# Patient Record
Sex: Female | Born: 1965 | Race: White | Hispanic: No | Marital: Married
Health system: Southern US, Community
[De-identification: ages and names within clinical notes are randomized; demographics above are authoritative.]

## PROBLEM LIST (undated history)

## (undated) DIAGNOSIS — I471 Supraventricular tachycardia: Secondary | ICD-10-CM

---

## 2020-05-21 ENCOUNTER — Encounter (HOSPITAL_COMMUNITY): Payer: Self-pay | Admitting: Emergency Medicine

## 2020-05-21 ENCOUNTER — Emergency Department (HOSPITAL_COMMUNITY)
Admission: EM | Admit: 2020-05-21 | Discharge: 2020-05-21 | Disposition: A | Discharge status: Home / Self Care | Payer: Managed Care, Other (non HMO) | Attending: Emergency Medicine | Admitting: Emergency Medicine

## 2020-05-21 ENCOUNTER — Emergency Department (HOSPITAL_COMMUNITY): Payer: Managed Care, Other (non HMO)

## 2020-05-21 ENCOUNTER — Other Ambulatory Visit: Payer: Self-pay

## 2020-05-21 DIAGNOSIS — R000 Tachycardia, unspecified: Secondary | ICD-10-CM | POA: Diagnosis present

## 2020-05-21 DIAGNOSIS — I471 Supraventricular tachycardia: Secondary | ICD-10-CM | POA: Diagnosis not present

## 2020-05-21 DIAGNOSIS — R110 Nausea: Secondary | ICD-10-CM | POA: Insufficient documentation

## 2020-05-21 HISTORY — DX: Supraventricular tachycardia: I47.1

## 2020-05-21 LAB — CBC WITH DIFFERENTIAL/PLATELET
Abs Immature Granulocytes: 0.04 10*3/uL (ref 0.00–0.07)
Basophils Absolute: 0.1 10*3/uL (ref 0.0–0.1)
Basophils Relative: 1 %
Eosinophils Absolute: 0.1 10*3/uL (ref 0.0–0.5)
Eosinophils Relative: 1 %
HCT: 40.7 % (ref 36.0–46.0)
Hemoglobin: 13.9 g/dL (ref 12.0–15.0)
Immature Granulocytes: 0 %
Lymphocytes Relative: 15 %
Lymphs Abs: 1.4 10*3/uL (ref 0.7–4.0)
MCH: 33.2 pg (ref 26.0–34.0)
MCHC: 34.2 g/dL (ref 30.0–36.0)
MCV: 97.1 fL (ref 80.0–100.0)
Monocytes Absolute: 0.7 10*3/uL (ref 0.1–1.0)
Monocytes Relative: 7 %
Neutro Abs: 6.8 10*3/uL (ref 1.7–7.7)
Neutrophils Relative %: 76 %
Platelets: 234 10*3/uL (ref 150–400)
RBC: 4.19 MIL/uL (ref 3.87–5.11)
RDW: 12.5 % (ref 11.5–15.5)
WBC: 9 10*3/uL (ref 4.0–10.5)
nRBC: 0 % (ref 0.0–0.2)

## 2020-05-21 LAB — TSH: TSH: 0.279 u[IU]/mL — ABNORMAL LOW (ref 0.350–4.500)

## 2020-05-21 LAB — COMPREHENSIVE METABOLIC PANEL
ALT: 31 U/L (ref 0–44)
AST: 30 U/L (ref 15–41)
Albumin: 3.8 g/dL (ref 3.5–5.0)
Alkaline Phosphatase: 51 U/L (ref 38–126)
Anion gap: 6 (ref 5–15)
BUN: 9 mg/dL (ref 6–20)
CO2: 27 mmol/L (ref 22–32)
Calcium: 8.6 mg/dL — ABNORMAL LOW (ref 8.9–10.3)
Chloride: 101 mmol/L (ref 98–111)
Creatinine, Ser: 1.06 mg/dL — ABNORMAL HIGH (ref 0.44–1.00)
GFR, Estimated: >60 mL/min (ref >60)
Glucose, Bld: 93 mg/dL (ref 70–99)
Potassium: 4 mmol/L (ref 3.5–5.1)
Sodium: 134 mmol/L — ABNORMAL LOW (ref 135–145)
Total Bilirubin: 0.9 mg/dL (ref 0.3–1.2)
Total Protein: 6.5 g/dL (ref 6.5–8.1)

## 2020-05-21 LAB — TROPONIN I (HIGH SENSITIVITY)
Troponin I (High Sensitivity): 56 ng/L — ABNORMAL HIGH (ref <18)
Troponin I (High Sensitivity): 189 ng/L (ref <18)

## 2020-05-21 LAB — MAGNESIUM: Magnesium: 1.8 mg/dL (ref 1.7–2.4)

## 2020-05-21 MED ORDER — METOPROLOL SUCCINATE ER 25 MG PO TB24: 12.5000 mg | ORAL_TABLET | Freq: Every day | ORAL | 2 refills | Status: AC

## 2020-05-21 NOTE — Discharge Instructions (Addendum)
Please follow-up with your primary care as soon as you get home, schedule appointment with them.  I want you to take the beta-blocker daily.  Please schedule an appointment with your cardiologist as we discussed, I also want you to talk to your primary care about your low TSH.  If you have any new worsening concerning symptom please come back to the emergency department.

## 2020-05-21 NOTE — ED Triage Notes (Signed)
[name] from home. Complaint of SVT this morning HR of 209. Hx of SVT. Patient received 6, 12, 12 of adenosine. After 1st dose of 12, brief VT, then back to SVT. Second dose of 12 patient converted to NS. Patient feeling normal upon arrival to department.

## 2020-05-21 NOTE — ED Provider Notes (Signed)
[facility] EMERGENCY DEPARTMENT Provider Note   CSN: [id number] Arrival date & time: 05/21/20  1975     History Chief Complaint  Patient presents with  . SVT    Megan Weeks is a 55 y.o. female with pertinent past medical history of SVT, thyroid disease that presents the emergency department today from [place] via EMS for SVT.  Patient called out to EMS this morning after having palpitations with some chest pain and nausea this morning, patient states that she felt as if she was in SVT.  Complaining of chest pain and palpitations that it began simultaneously.  No shortness of breath.  Patient states that she is normally able to bear down, however this is the worst episode she had therefore calling EMS.  EMS noted a rate of 209, patient received 6 of adenosine, 12 of adenosine and then another 12 adenosine, she had a brief episode of VT tach at that time and then converted back into normal sinus.  EKG is in chart.  Patient states that after she converted she had immediate relief of symptoms.  Coming to the ER patient states that she is completely asymptomatic.  Patient states that she takes levothyroxine, bupropion and as needed Xanax.  Patient states that she tried to take 2 Xanax this morning, however did not help relieve symptoms.  Patient states that she has had 3 episodes of SVT in her life, was diagnosed with this when she was much younger.  Is not on any medications for this.  States that she took a beta-blocker for couple years however had side effects from this therefore stopped it.  Patient states that she was feeling fine yesterday and when she woke up this morning, however when she started getting a shower she started having the symptoms.  Patient states that she is here from [place] for her daughter's freshman year orientation.  States that her last episode was 3 weeks ago, was able to control symptoms on her own, prior to this her last time she had SVT  was about 12 years ago.  Denies any shortness of breath, fevers nausea vomiting, abdominal pain or back pain, paresthesias.  HPI     Past Medical History:  Diagnosis Date  . SVT (supraventricular tachycardia) (HCC)     There are no problems to display for this patient.   OB History   No obstetric history on file.     No family history on file.     Home Medications Prior to Admission medications   Medication Sig Start Date End Date Taking? Authorizing Provider  metoprolol succinate (TOPROL-XL) 25 MG 24 hr tablet Take 0.5 tablets (12.5 mg total) by mouth daily. 05/21/20 06/20/20 Yes Farrel Gordon, PA-C    Allergies    Patient has no known allergies.  Review of Systems   Review of Systems  Constitutional: Negative for chills, diaphoresis, fatigue and fever.  HENT: Negative for congestion, sore throat and trouble swallowing.   Eyes: Negative for pain and visual disturbance.  Respiratory: Negative for cough, shortness of breath and wheezing.   Cardiovascular: Positive for chest pain and palpitations. Negative for leg swelling.  Gastrointestinal: Positive for nausea. Negative for abdominal distention, abdominal pain, diarrhea and vomiting.  Genitourinary: Negative for difficulty urinating.  Musculoskeletal: Negative for back pain, neck pain and neck stiffness.  Skin: Negative for pallor.  Neurological: Negative for dizziness, speech difficulty, weakness and headaches.  Psychiatric/Behavioral: Negative for confusion.    Physical Exam Updated Vital  Signs BP (!) 123/92   Pulse 85   Temp 98 F (36.7 C) (Oral)   Resp 18   Ht 5\' 8"  (1.727 m)   Wt 74.8 kg   SpO2 100%   BMI 25.09 kg/m   Physical Exam Constitutional:      General: She is not in acute distress.    Appearance: Normal appearance. She is not ill-appearing, toxic-appearing or diaphoretic.     Comments: No acute distress.   HENT:     Mouth/Throat:     Mouth: Mucous membranes are moist.     Pharynx:  Oropharynx is clear.  Eyes:     General: No scleral icterus.    Extraocular Movements: Extraocular movements intact.     Pupils: Pupils are equal, round, and reactive to light.  Cardiovascular:     Rate and Rhythm: Normal rate and regular rhythm.     Pulses: Normal pulses.     Heart sounds: Normal heart sounds.  Pulmonary:     Effort: Pulmonary effort is normal. No respiratory distress.     Breath sounds: Normal breath sounds. No stridor. No wheezing, rhonchi or rales.  Chest:     Chest wall: No tenderness.  Abdominal:     General: Abdomen is flat. There is no distension.     Palpations: Abdomen is soft.     Tenderness: There is no abdominal tenderness. There is no guarding or rebound.  Musculoskeletal:        General: No swelling or tenderness. Normal range of motion.     Cervical back: Normal range of motion and neck supple. No rigidity.     Right lower leg: No edema.     Left lower leg: No edema.  Skin:    General: Skin is warm and dry.     Capillary Refill: Capillary refill takes less than 2 seconds.     Coloration: Skin is not pale.  Neurological:     General: No focal deficit present.     Mental Status: She is alert and oriented to person, place, and time.  Psychiatric:        Mood and Affect: Mood normal.        Behavior: Behavior normal.     ED Results / Procedures / Treatments   Labs (all labs ordered are listed, but only abnormal results are displayed) Labs Reviewed  COMPREHENSIVE METABOLIC PANEL - Abnormal; Notable for the following components:      Result Value   Sodium 134 (*)    Creatinine, Ser 1.06 (*)    Calcium 8.6 (*)    All other components within normal limits  TSH - Abnormal; Notable for the following components:   TSH 0.279 (*)    All other components within normal limits  TROPONIN I (HIGH SENSITIVITY) - Abnormal; Notable for the following components:   Troponin I (High Sensitivity) 56 (*)    All other components within normal limits  TROPONIN  I (HIGH SENSITIVITY) - Abnormal; Notable for the following components:   Troponin I (High Sensitivity) 189 (*)    All other components within normal limits  CBC WITH DIFFERENTIAL/PLATELET  MAGNESIUM    EKG EKG Interpretation  Date/Time:  Saturday May 21 2020 10:00:21 EDT Ventricular Rate:  100 PR Interval:    QRS Duration: 90 QT Interval:  293 QTC Calculation: 378 R Axis:   50 Text Interpretation: Sinus tachycardia Nonspecific repol abnormality, diffuse leads No previous ECGs available Confirmed by [phone] [id number]) on 05/21/2020 10:04:35 AM  Radiology DG Chest 2 View  Result Date: 05/21/2020 CLINICAL DATA:  Chest pain, shortness of breath, SVT EXAM: CHEST - 2 VIEW COMPARISON:  None. FINDINGS: Lungs are clear.  No pleural effusion or pneumothorax. The heart is normal in size. Visualized osseous structures are within normal limits. IMPRESSION: Normal chest radiographs. Electronically Signed   By: Charline Bills M.D.   On: 05/21/2020 10:28    Procedures Procedures   Medications Ordered in ED Medications - No data to display  ED Course  I have reviewed the triage vital signs and the nursing notes.  Pertinent labs & imaging results that were available during my care of the patient were reviewed by me and considered in my medical decision making (see chart for details).    MDM Rules/Calculators/A&P                         Megan Weeks is a 55 y.o. female with pertinent past medical history of SVT, thyroid disease that presents the emergency department today from [place] via EMS for SVT.  Patient with a rate of 209 with EMS, EMS gave 6, 12, 12 adenosine, patient went to brief episode of V. tach and then converted back to sinus rhythm.  Patient is asymptomatic currently.  Hemodynamically stable.  Work-up today shows unremarkable CBC and BMP.  Chest x-ray interpreted me with any acute cardiopulmonary disease.  TSH is low, did speak to patient about this,  she states that she switch to a natural pathic doctor and she is working on this.  Troponin 56, repeat troponin I 89, patient has been completely chest pain-free chest pain palpitations started at the same time, spoke to Dr. Jens Som, cardiology about this he does not think that further work-up is needed.  No concerns for ACS, patient will follow up with cardiology and metoprolol initiated.  Strict return precautions given, patient be discharged.  Has been observed for over 3-1/2 hours and has been in normal sinus.  Doubt need for further emergent work up at this time. I explained the diagnosis and have given explicit precautions to return to the ER including for any other new or worsening symptoms. The patient understands and accepts the medical plan as it's been dictated and I have answered their questions. Discharge instructions concerning home care and prescriptions have been given. The patient is STABLE and is discharged to home in good condition.  I discussed this case with my attending physician who cosigned this note including patient's presenting symptoms, physical exam, and planned diagnostics and interventions. Attending physician stated agreement with plan or made changes to plan which were implemented.   Attending physician assessed patient at bedside.  Final Clinical Impression(s) / ED Diagnoses Final diagnoses:  SVT (supraventricular tachycardia) (HCC)    Rx / DC Orders ED Discharge Orders         Ordered    metoprolol succinate (TOPROL-XL) 25 MG 24 hr tablet  Daily        05/21/20 1311           Farrel Gordon, PA-C 05/21/20 1321    Jacalyn Lefevre, MD 05/21/20 1337

## 2020-05-21 NOTE — ED Notes (Signed)
Patient discharge instructions reviewed with the patient. The patient verbalized understanding of instructions. Patient discharged. 

## 2022-10-09 IMAGING — CR DG CHEST 2V
2 series · 2 of 2 positions shown · non-contrast
Comparison: None.

CLINICAL DATA: Chest pain, shortness of breath, SVT

EXAM:
CHEST - 2 VIEW

[chest pa]
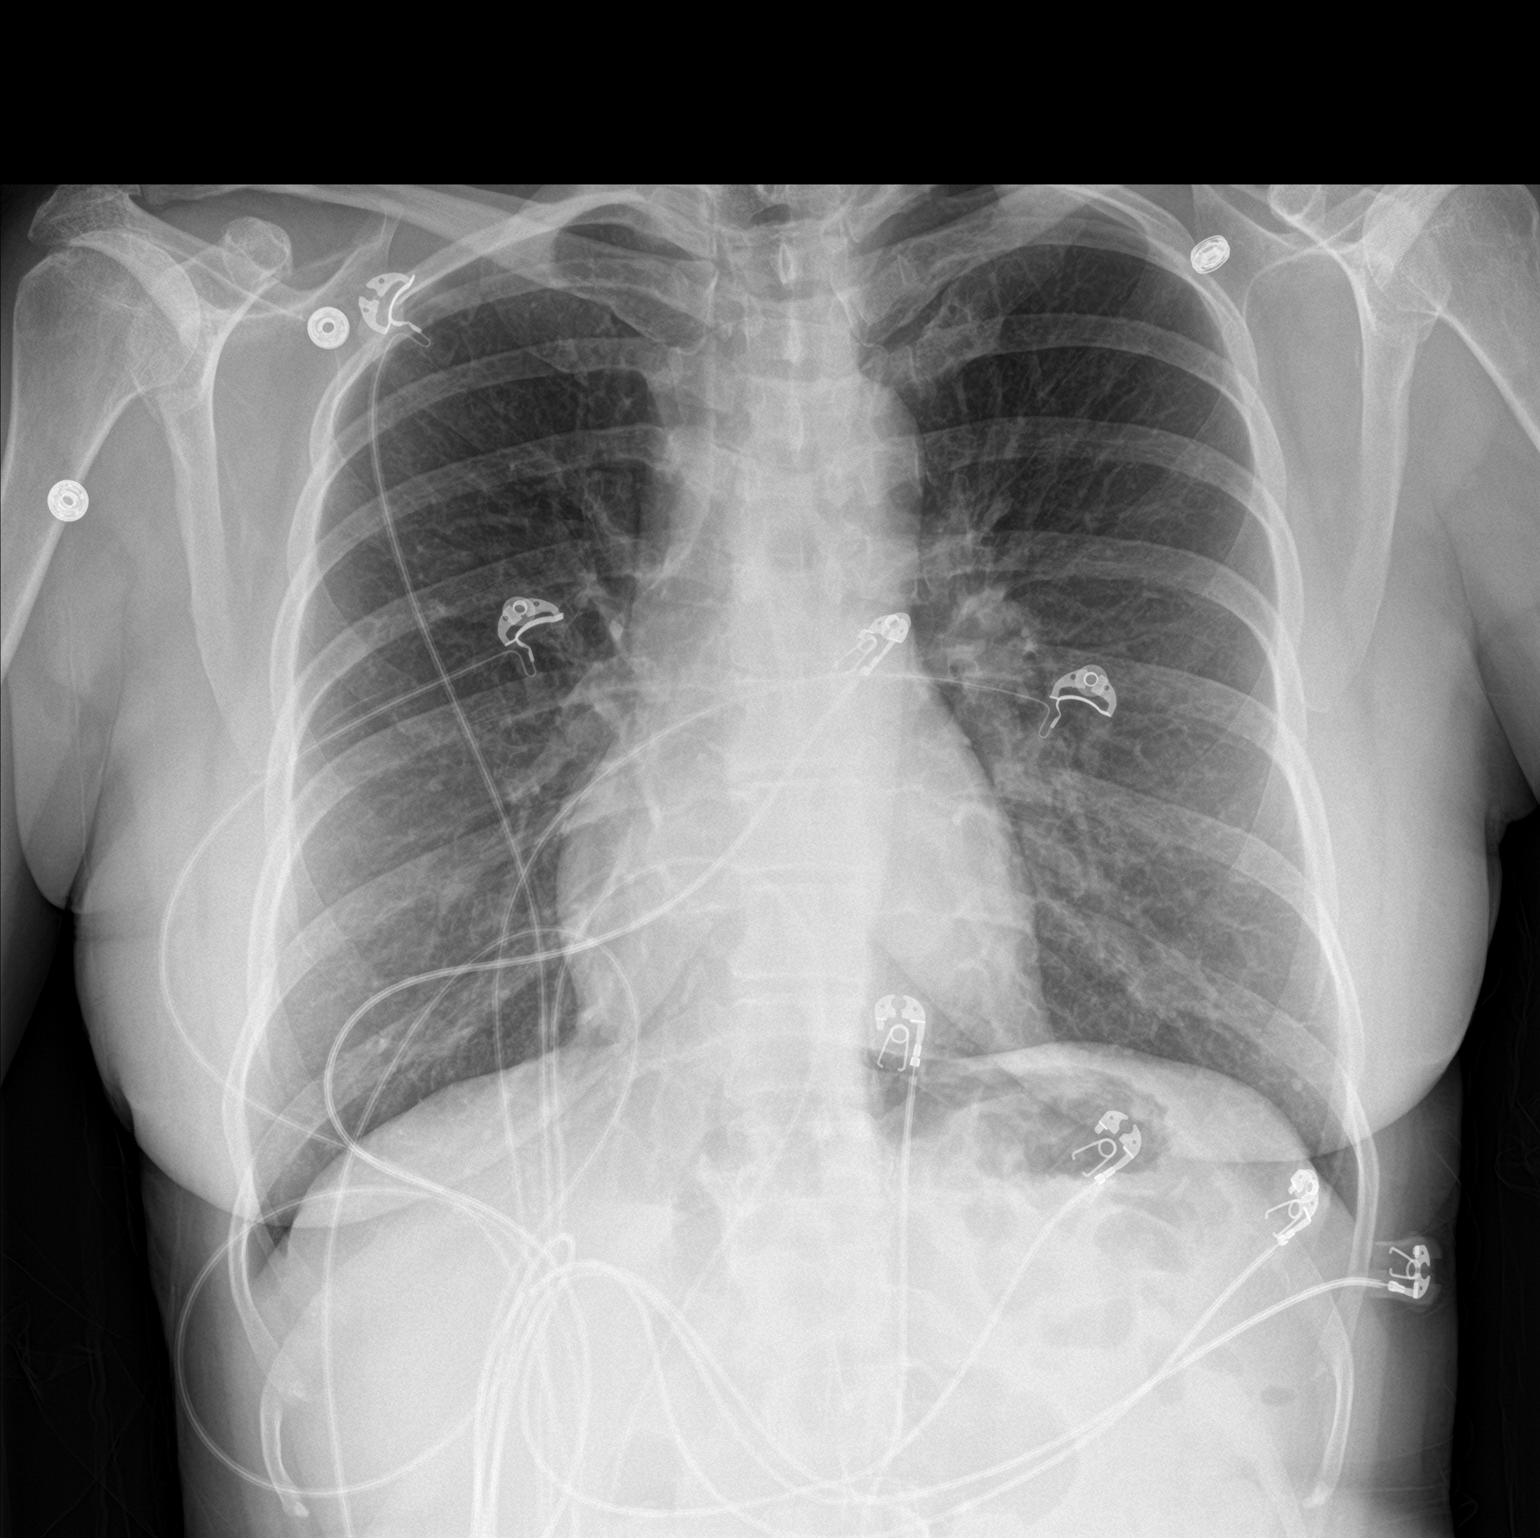

[chest lat]
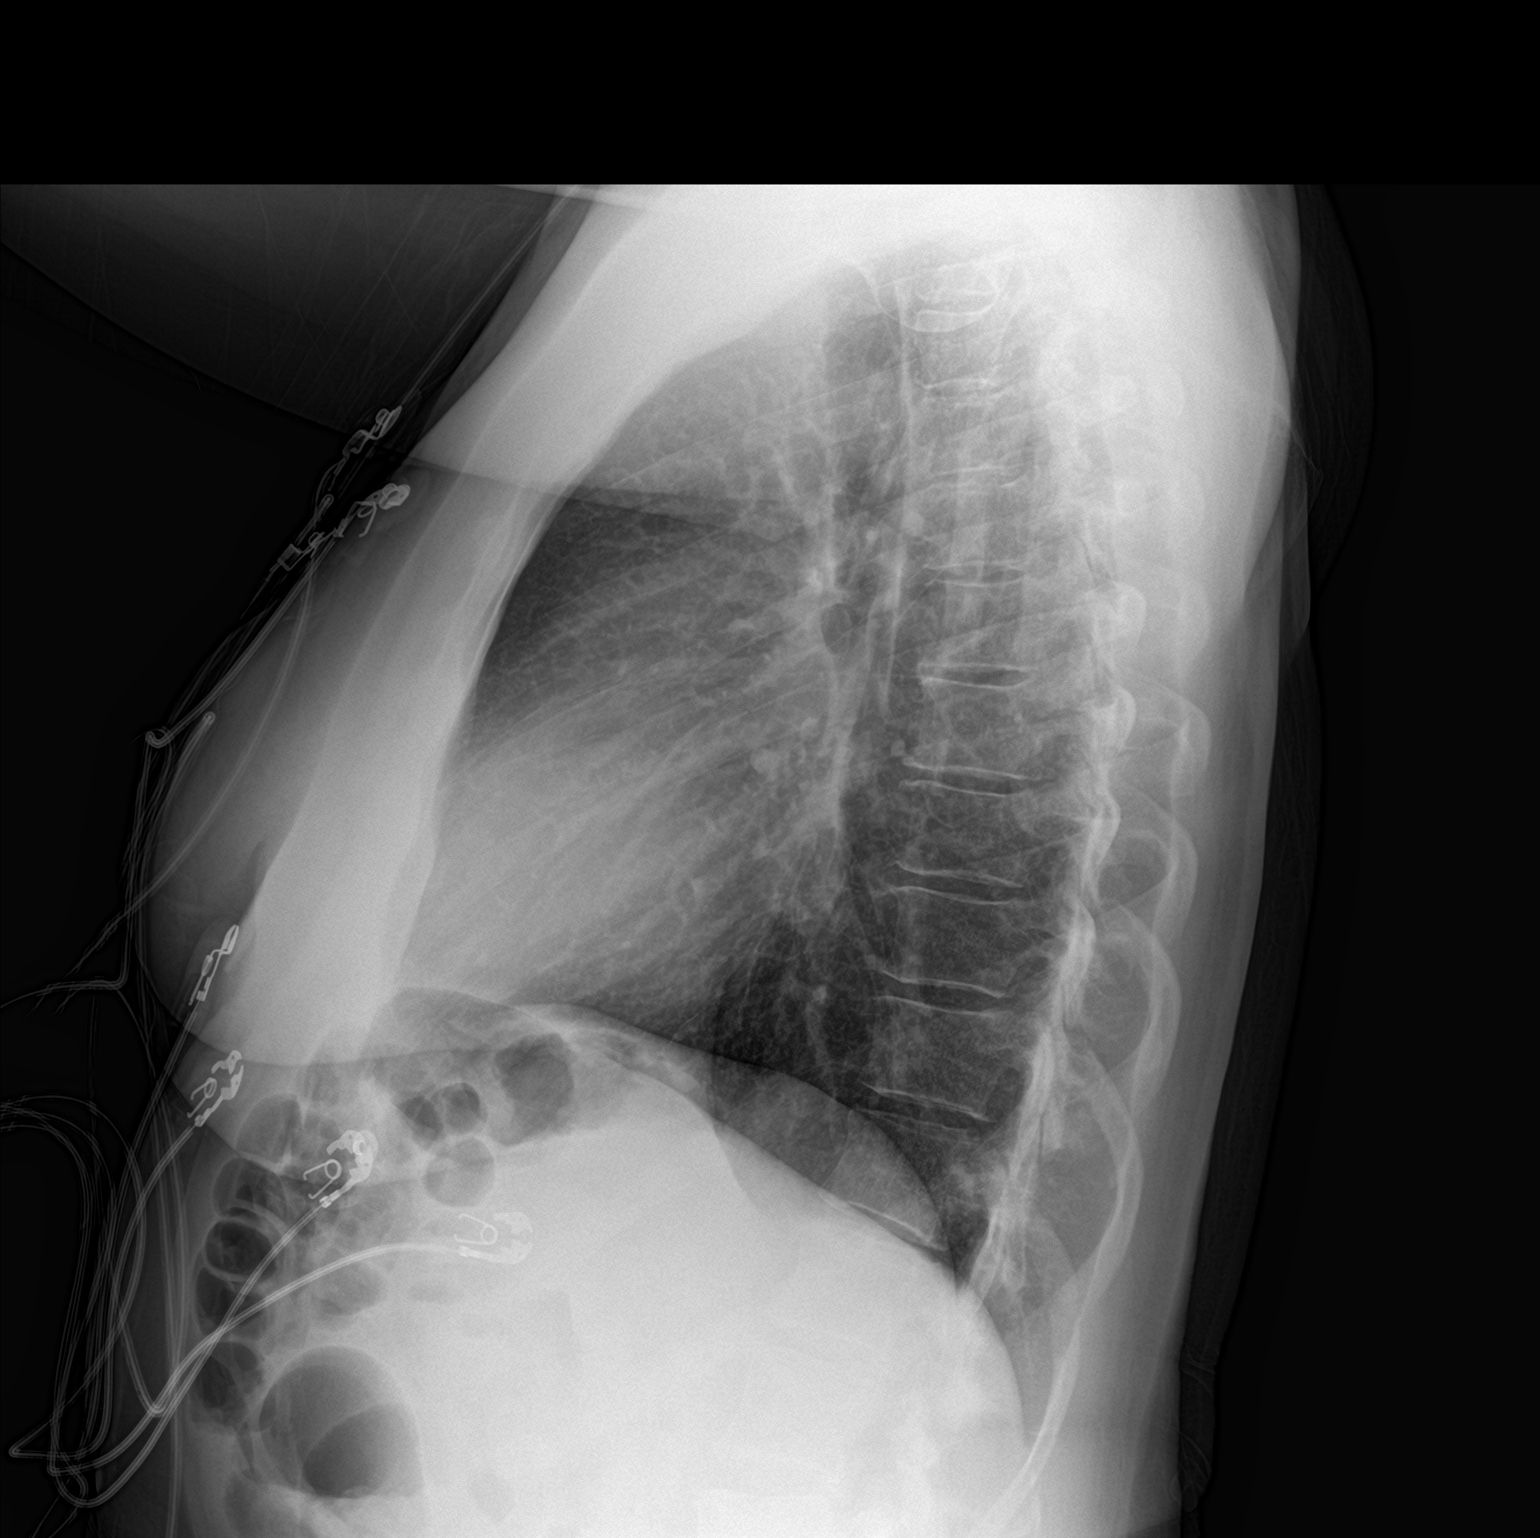

[2 of 2 positions shown; findings below may reference images not displayed]

FINDINGS: Lungs are clear.  No pleural effusion or pneumothorax.

The heart is normal in size.

Visualized osseous structures are within normal limits.
IMPRESSION: Normal chest radiographs.
